# Patient Record
Sex: Female | Born: 1970 | Race: Black or African American | Hispanic: No | Marital: Married | Smoking: Current every day smoker
Health system: Southern US, Community
[De-identification: ages and names within clinical notes are randomized; demographics above are authoritative.]

## PROBLEM LIST (undated history)

## (undated) DIAGNOSIS — I10 Essential (primary) hypertension: Secondary | ICD-10-CM

## (undated) DIAGNOSIS — J45909 Unspecified asthma, uncomplicated: Secondary | ICD-10-CM

## (undated) HISTORY — PX: TUBAL LIGATION: SHX77

---

## 2014-02-19 ENCOUNTER — Emergency Department (HOSPITAL_COMMUNITY)
Admission: EM | Admit: 2014-02-19 | Discharge: 2014-02-19 | Disposition: A | Discharge status: Home / Self Care | Payer: Self-pay | Attending: Emergency Medicine | Admitting: Emergency Medicine

## 2014-02-19 ENCOUNTER — Emergency Department (HOSPITAL_COMMUNITY): Payer: Self-pay

## 2014-02-19 ENCOUNTER — Encounter (HOSPITAL_COMMUNITY): Payer: Self-pay | Admitting: *Deleted

## 2014-02-19 DIAGNOSIS — J45901 Unspecified asthma with (acute) exacerbation: Secondary | ICD-10-CM | POA: Insufficient documentation

## 2014-02-19 DIAGNOSIS — I10 Essential (primary) hypertension: Secondary | ICD-10-CM | POA: Insufficient documentation

## 2014-02-19 DIAGNOSIS — Z720 Tobacco use: Secondary | ICD-10-CM | POA: Insufficient documentation

## 2014-02-19 DIAGNOSIS — E876 Hypokalemia: Secondary | ICD-10-CM | POA: Insufficient documentation

## 2014-02-19 HISTORY — DX: Essential (primary) hypertension: I10

## 2014-02-19 HISTORY — DX: Unspecified asthma, uncomplicated: J45.909

## 2014-02-19 LAB — CBC WITH DIFFERENTIAL/PLATELET
BASOS PCT: 0 % (ref 0–1)
Basophils Absolute: 0 10*3/uL (ref 0.0–0.1)
Eosinophils Absolute: 0 10*3/uL (ref 0.0–0.7)
Eosinophils Relative: 1 % (ref 0–5)
HEMATOCRIT: 33.6 % — AB (ref 36.0–46.0)
HEMOGLOBIN: 11 g/dL — AB (ref 12.0–15.0)
LYMPHS ABS: 0.9 10*3/uL (ref 0.7–4.0)
Lymphocytes Relative: 25 % (ref 12–46)
MCH: 27.5 pg (ref 26.0–34.0)
MCHC: 32.7 g/dL (ref 30.0–36.0)
MCV: 84 fL (ref 78.0–100.0)
Monocytes Absolute: 0.4 10*3/uL (ref 0.1–1.0)
Monocytes Relative: 11 % (ref 3–12)
NEUTROS ABS: 2.3 10*3/uL (ref 1.7–7.7)
NEUTROS PCT: 63 % (ref 43–77)
PLATELETS: 279 10*3/uL (ref 150–400)
RBC: 4 MIL/uL (ref 3.87–5.11)
RDW: 15.2 % (ref 11.5–15.5)
WBC: 3.7 10*3/uL — AB (ref 4.0–10.5)

## 2014-02-19 LAB — BASIC METABOLIC PANEL
Anion gap: 9 (ref 5–15)
BUN: 10 mg/dL (ref 6–23)
CO2: 26 mmol/L (ref 19–32)
CREATININE: 0.9 mg/dL (ref 0.50–1.10)
Calcium: 9.2 mg/dL (ref 8.4–10.5)
Chloride: 104 mmol/L (ref 96–112)
GFR calc Af Amer: 89 mL/min — ABNORMAL LOW (ref >90)
GFR calc non Af Amer: 77 mL/min — ABNORMAL LOW (ref >90)
GLUCOSE: 85 mg/dL (ref 70–99)
Potassium: 2.8 mmol/L — ABNORMAL LOW (ref 3.5–5.1)
SODIUM: 139 mmol/L (ref 135–145)

## 2014-02-19 LAB — I-STAT TROPONIN, ED: Troponin i, poc: 0 ng/mL (ref 0.00–0.08)

## 2014-02-19 MED ORDER — PREDNISONE 20 MG PO TABS: 60.0000 mg | ORAL_TABLET | Freq: Every day | ORAL | Status: AC

## 2014-02-19 MED ORDER — ALBUTEROL SULFATE (2.5 MG/3ML) 0.083% IN NEBU
5.0000 mg | INHALATION_SOLUTION | Freq: Once | RESPIRATORY_TRACT | Status: AC
  Administered 2014-02-19: 5 mg via RESPIRATORY_TRACT
  Filled 2014-02-19: qty 6

## 2014-02-19 MED ORDER — SODIUM CHLORIDE 0.9 % IV BOLUS (SEPSIS)
1000.0000 mL | Freq: Once | INTRAVENOUS | Status: AC
Start: 2014-02-19 — End: 2014-02-19
  Administered 2014-02-19: 1000 mL via INTRAVENOUS

## 2014-02-19 MED ORDER — ALBUTEROL SULFATE HFA 108 (90 BASE) MCG/ACT IN AERS: 2.0000 | INHALATION_SPRAY | RESPIRATORY_TRACT | Status: AC | PRN

## 2014-02-19 MED ORDER — LORAZEPAM 2 MG/ML IJ SOLN
1.0000 mg | Freq: Once | INTRAMUSCULAR | Status: AC
  Administered 2014-02-19: 1 mg via INTRAVENOUS
  Filled 2014-02-19: qty 1

## 2014-02-19 MED ORDER — POTASSIUM CHLORIDE CRYS ER 20 MEQ PO TBCR
40.0000 meq | EXTENDED_RELEASE_TABLET | Freq: Once | ORAL | Status: AC
  Administered 2014-02-19: 40 meq via ORAL
  Filled 2014-02-19: qty 2

## 2014-02-19 MED ORDER — ALBUTEROL (5 MG/ML) CONTINUOUS INHALATION SOLN
10.0000 mg/h | INHALATION_SOLUTION | Freq: Once | RESPIRATORY_TRACT | Status: AC
  Administered 2014-02-19: 10 mg/h via RESPIRATORY_TRACT
  Filled 2014-02-19: qty 20

## 2014-02-19 MED ORDER — POTASSIUM CHLORIDE ER 20 MEQ PO TBCR: 20.0000 meq | EXTENDED_RELEASE_TABLET | Freq: Two times a day (BID) | ORAL | Status: AC

## 2014-02-19 MED ORDER — METHYLPREDNISOLONE SODIUM SUCC 125 MG IJ SOLR
125.0000 mg | Freq: Once | INTRAMUSCULAR | Status: AC
  Administered 2014-02-19: 125 mg via INTRAVENOUS
  Filled 2014-02-19: qty 2

## 2014-02-19 MED ORDER — ALBUTEROL SULFATE HFA 108 (90 BASE) MCG/ACT IN AERS
2.0000 | INHALATION_SPRAY | Freq: Once | RESPIRATORY_TRACT | Status: AC
  Administered 2014-02-19: 2 via RESPIRATORY_TRACT
  Filled 2014-02-19: qty 6.7

## 2014-02-19 NOTE — Discharge Instructions (Signed)
Asthma, Acute Bronchospasm °Acute bronchospasm caused by asthma is also referred to as an asthma attack. Bronchospasm means your air passages become narrowed. The narrowing is caused by inflammation and tightening of the muscles in the air tubes (bronchi) in your lungs. This can make it hard to breathe or cause you to wheeze and cough. °CAUSES °Possible triggers are: °· Animal dander from the skin, hair, or feathers of animals. °· Dust mites contained in house dust. °· Cockroaches. °· Pollen from trees or grass. °· Mold. °· Cigarette or tobacco smoke. °· Air pollutants such as dust, household cleaners, hair sprays, aerosol sprays, paint fumes, strong chemicals, or strong odors. °· Cold air or weather changes. Cold air may trigger inflammation. Winds increase molds and pollens in the air. °· Strong emotions such as crying or laughing hard. °· Stress. °· Certain medicines such as aspirin or beta-blockers. °· Sulfites in foods and drinks, such as dried fruits and wine. °· Infections or inflammatory conditions, such as a flu, cold, or inflammation of the nasal membranes (rhinitis). °· Gastroesophageal reflux disease (GERD). GERD is a condition where stomach acid backs up into your esophagus. °· Exercise or strenuous activity. °SIGNS AND SYMPTOMS  °· Wheezing. °· Excessive coughing, particularly at night. °· Chest tightness. °· Shortness of breath. °DIAGNOSIS  °Your health care provider will ask you about your medical history and perform a physical exam. A chest X-ray or blood testing may be performed to look for other causes of your symptoms or other conditions that may have triggered your asthma attack.  °TREATMENT  °Treatment is aimed at reducing inflammation and opening up the airways in your lungs.  Most asthma attacks are treated with inhaled medicines. These include quick relief or rescue medicines (such as bronchodilators) and controller medicines (such as inhaled corticosteroids). These medicines are sometimes  given through an inhaler or a nebulizer. Systemic steroid medicine taken by mouth or given through an IV tube also can be used to reduce the inflammation when an attack is moderate or severe. Antibiotic medicines are only used if a bacterial infection is present.  °HOME CARE INSTRUCTIONS  °· Rest. °· Drink plenty of liquids. This helps the mucus to remain thin and be easily coughed up. Only use caffeine in moderation and do not use alcohol until you have recovered from your illness. °· Do not smoke. Avoid being exposed to secondhand smoke. °· You play a critical role in keeping yourself in good health. Avoid exposure to things that cause you to wheeze or to have breathing problems. °· Keep your medicines up-to-date and available. Carefully follow your health care provider's treatment plan. °· Take your medicine exactly as prescribed. °· When pollen or pollution is bad, keep windows closed and use an air conditioner or go to places with air conditioning. °· Asthma requires careful medical care. See your health care provider for a follow-up as advised. If you are more than [redacted] weeks pregnant and you were prescribed any new medicines, let your obstetrician know about the visit and how you are doing. Follow up with your health care provider as directed. °· After you have recovered from your asthma attack, make an appointment with your outpatient doctor to talk about ways to reduce the likelihood of future attacks. If you do not have a doctor who manages your asthma, make an appointment with a primary care doctor to discuss your asthma. °SEEK IMMEDIATE MEDICAL CARE IF:  °· You are getting worse. °· You have trouble breathing. If severe, call your local   emergency services (911 in the [place]  You develop chest pain or discomfort.  You are vomiting.  You are not able to keep fluids down.  You are coughing up yellow, green, brown, or bloody sputum.  You have a fever and your symptoms suddenly get worse.  You have  trouble swallowing. MAKE SURE YOU:   Understand these instructions.  Will watch your condition.  Will get help right away if you are not doing well or get worse. Document Released: 04/25/2006 Document Revised: 01/13/2013 Document Reviewed: 07/16/2012 [facility] Patient Information 2015 [place], [place]. This information is not intended to replace advice given to you by your health care provider. Make sure you discuss any questions you have with your health care provider.  Hypokalemia Hypokalemia means that the amount of potassium in the blood is lower than normal.Potassium is a chemical, called an electrolyte, that helps regulate the amount of fluid in the body. It also stimulates muscle contraction and helps nerves function properly.Most of the body's potassium is inside of cells, and only a very small amount is in the blood. Because the amount in the blood is so small, minor changes can be life-threatening. CAUSES  Antibiotics.  Diarrhea or vomiting.  Using laxatives too much, which can cause diarrhea.  Chronic kidney disease.  Water pills (diuretics).  Eating disorders (bulimia).  Low magnesium level.  Sweating a lot. SIGNS AND SYMPTOMS  Weakness.  Constipation.  Fatigue.  Muscle cramps.  Mental confusion.  Skipped heartbeats or irregular heartbeat (palpitations).  Tingling or numbness. DIAGNOSIS  Your health care provider can diagnose hypokalemia with blood tests. In addition to checking your potassium level, your health care provider may also check other lab tests. TREATMENT Hypokalemia can be treated with potassium supplements taken by mouth or adjustments in your current medicines. If your potassium level is very low, you may need to get potassium through a vein (IV) and be monitored in the hospital. A diet high in potassium is also helpful. Foods high in potassium are:  Nuts, such as peanuts and pistachios.  Seeds, such as sunflower seeds and pumpkin  seeds.  Peas, lentils, and lima beans.  Whole grain and bran cereals and breads.  Fresh fruit and vegetables, such as apricots, avocado, bananas, cantaloupe, kiwi, oranges, tomatoes, asparagus, and potatoes.  Orange and tomato juices.  Red meats.  Fruit yogurt. HOME CARE INSTRUCTIONS  Take all medicines as prescribed by your health care provider.  Maintain a healthy diet by including nutritious food, such as fruits, vegetables, nuts, whole grains, and lean meats.  If you are taking a laxative, be sure to follow the directions on the label. SEEK MEDICAL CARE IF:  Your weakness gets worse.  You feel your heart pounding or racing.  You are vomiting or having diarrhea.  You are diabetic and having trouble keeping your blood glucose in the normal range. SEEK IMMEDIATE MEDICAL CARE IF:  You have chest pain, shortness of breath, or dizziness.  You are vomiting or having diarrhea for more than 2 days.  You faint. MAKE SURE YOU:   Understand these instructions.  Will watch your condition.  Will get help right away if you are not doing well or get worse. Document Released: 01/08/2005 Document Revised: 10/29/2012 Document Reviewed: 07/11/2012 [facility] Patient Information 2015 [place], [place]. This information is not intended to replace advice given to you by your health care provider. Make sure you discuss any questions you have with your health care provider.     Emergency Department Resource  Guide 1) Find a Doctor and Pay Out of Pocket Although you won't have to find out who is covered by your insurance plan, it is a good idea to ask around and get recommendations. You will then need to call the office and see if the doctor you have chosen will accept you as a new patient and what types of options they offer for patients who are self-pay. Some doctors offer discounts or will set up payment plans for their patients who do not have insurance, but you will need to ask so you  aren't surprised when you get to your appointment.  2) Contact Your Local Health Department Not all health departments have doctors that can see patients for sick visits, but many do, so it is worth a call to see if yours does. If you don't know where your local health department is, you can check in your phone book. The CDC also has a tool to help you locate your state's health department, and many state websites also have listings of all of their local health departments.  3) Find a Walk-in Clinic If your illness is not likely to be very severe or complicated, you may want to try a walk in clinic. These are popping up all over the country in pharmacies, drugstores, and shopping centers. They're usually staffed by nurse practitioners or physician assistants that have been trained to treat common illnesses and complaints. They're usually fairly quick and inexpensive. However, if you have serious medical issues or chronic medical problems, these are probably not your best option.  No Primary Care Doctor: - Call Health Connect at  [phone] - they can help you locate a primary care doctor that  accepts your insurance, provides certain services, etc. - Physician Referral Service- [phone]  Chronic Pain Problems: Organization         Address  Phone   Notes  [facility]  [phone] Patients need to be referred by their primary care doctor.   Medication Assistance: Organization         Address  Phone   Notes  [facility] Medication [facility] [address] [place], [place] [id number] [phone]) [phone] --Must be a resident of [place] -- Must have NO insurance coverage whatsoever (no Medicaid/ Medicare, etc.) -- The pt. MUST have a primary care doctor that directs their care regularly and follows them in the community   MedAssist  [phone]   [employer]  [phone]    Agencies that provide inexpensive medical care: Organization          Address  Phone   Notes  [facility]  [phone]   [facility]    [phone]   [facility] [address] [place], [place] [address] [phone]) [phone]   [facility] [address], [place] [phone]) [phone]   [facility]    [phone](336) [phone]   [facility]    [phone]) [phone]   [facility]  [address], [place] Phone:  [phone], Fax:  [phone] Hours of Operation:  9 am - 6 pm, M-F.  Also accepts Medicaid/Medicare and self-pay.  [facility]  [address], [place] Phone: [phone](336) [phone], Fax: [phone]) [phone]. Hours of Operation:  8:30 am - 5:30 pm, M-F.  Also accepts Medicaid and self-pay.  [facility] [place] [address],  [place] Phone: [phone]   [facility] [address] Natasha Bence [place], [place] [phone], Ext. [phone] Mondays & Thursdays: 7-9 AM.  First 15 patients are seen on a first come, first serve basis.    Medicaid-accepting [facility] Providers:  Organization         Address  Phone   Notes  [facility] [address], [place] [phone] Also accepts self-pay patients.  [facility] [address] Laurell Josephs [place], [place]  [phone]   [facility] [address], [place] [phone]   [facility] [address], [place] [phone]   Renaye Rakers [address], [place]   [phone] Only accepts [place] patients after they have their name applied to their card.   Self-Pay (no insurance) in [facility]:  Organization         Address  Phone   Notes  Sickle Cell Patients, [facility] [address] [place], [place] [phone]   [facility] [address] [place], [place] [phone]    [facility] [place]  [address], [place] [phone]   [facility]/Dr. Osei-Bonsu  [address] [place] or [address], [place] [phone] Phone number for both [place] and [place] locations is the same.  [facility] [address], [place] [phone]   [facility] [address] [place] or [address] [phone]   [facility] [address], [place] [phone], phone; [phone], fax Sees patients 1st and 3rd Saturday of every month.  Must not qualify for public or private insurance (i.e. Medicaid, Medicare, [place] Health Choice, Veterans' Benefits)  Household income should be no more than 200% of the poverty level The clinic cannot treat you if you are pregnant or think you are pregnant  Sexually transmitted diseases are not treated at the clinic.    Dental Care: Organization         Address  Phone  Notes  [facility] Department of [facility] [address] [place], [place] [phone] Accepts children up to age 19 who are enrolled in [place] or [place] Health Choice; pregnant women with a Medicaid card; and children who have applied for Medicaid or [place] Health Choice, but were declined, whose parents can pay a reduced fee at time of service.  [facility] Department of [facility]  [address], [place] [phone] Accepts children up to age 33 who are enrolled in [place] or [place] Health Choice; pregnant women with a Medicaid card; and children who have applied for Medicaid or [place] Health Choice, but were declined, whose parents can pay a reduced fee at time of service.  [facility]  [address] [place], [place] [phone] Patients are seen by appointment only. Walk-ins are not accepted. [facility] will see patients 33  years of age and older. Monday - Tuesday (8am-5pm) Most Wednesdays (8:30-5pm) $30 per visit, cash only  [facility] Adult Dental Access PROGRAM  [address], [facility] [phone] Patients are seen by appointment only. Walk-ins are not accepted. [facility] will see patients 41 years of age and older. One Wednesday Evening (  Monthly: Volunteer Based).  $30 per visit, cash only  [employer]  [phone] for adults; Children under age 35, call Graduate Pediatric Dentistry at [phone]. Children aged 64-14, please call [phone] to request a pediatric application.  Dental services are provided in all areas of dental care including fillings, crowns and bridges, complete and partial dentures, implants, gum treatment, root canals, and extractions. Preventive care is also provided. Treatment is provided to both adults and children. Patients are selected via a lottery and there is often a waiting list.   [facility] [address] [place]  [phone] [email or web address]   [facility] [address] [place], [place] [phone], Ext. [phone] Second and Fourth Thursday of each month, opens at 6:30 AM; Clinic ends at 9 AM.  Patients are seen on a first-come first-served basis, and a limited number are seen during each clinic.   [facility]  [address] Ether Griffins [place], [place] [phone]   Eligibility Requirements You must have lived in [place], [place], or [place] counties for at least the last three months.   You cannot be eligible for state or federal sponsored [place], including [employer], [place], or [employer].   You generally cannot be eligible for healthcare insurance through your employer.    How to apply: Eligibility screenings are held every Tuesday and Wednesday afternoon from 1:00 pm until 4:00 pm. You do not need an appointment for the interview!  [facility]  [address] [place], [place] [phone]   [facility] Health Department  [phone]   [facility] Health Department  [phone]   [facility] Health Department  [phone]    Behavioral Health Resources in the Community: Intensive Outpatient Programs Organization         Address  Phone  Notes  [facility] [address] [place], [place] [phone]   [facility] Outpatient [address] [place], [place] [phone]   [facility] [address], [place], [place]  [phone]   [facility] Mental Health [address],  [place], [place] [phone] or [phone]   Substance Abuse Resources Organization         Address  Phone  Notes  [facility] Drug [facility]  [phone]   [facility]  [phone]   [facility]  [phone]   Floydene Flock  [phone]   Residential & Outpatient Substance Abuse Program  [phone]   Psychological Services Organization         Address  Phone  Notes  [facility]  [phone]   [facility] Services  [phone]   [facility] Mental Health [address], [place] [phone] or [phone]    Mobile Crisis Teams Organization         Address  Phone  Notes  [facility], Mobile Crisis Care Unit  [phone]   [facility]  [address] [place], [place] [phone]   Doristine Locks [address] [place] [phone]    Self-Help/Support Groups Organization         Address  Phone             Notes  [facility] [place] - variety of support groups  336- I7437963 Call for more information  Narcotics Anonymous (NA), [facility] [address], [employer] [place]  2 meetings at this location   Residential  Treatment Programs Organization         Address  Phone  Notes  [facility] [address],    [place]   [phone]   [facility]  [address] [place] [address], [place], [place] [phone]   [facility] [address] [place], [place] [phone] Admissions: 8am-3pm M-F  [facility] [address]    [place], [place] [phone]   [facility] [address] [place], [place], [place] [phone]   [facility] [address],  [place], [place] [phone]   [facility] - Intensive Outpatient [address] Laurell Josephs [address], [place], [place] [phone]   [facility] ([facility] [address] [place]  [place], [place] [phone] or [phone]   [facility] ([facility]) [address] [place], [place] [phone] Accepts Medicaid  [facility] [address],  [place] [phone] Substance Abuse/Addiction Treatment   [facility] Organization         Address  Phone  Notes  [facility]  [phone]   Angie Fava, PhD [address] Ervin Knack [place], [place]   [phone] or [phone]   [facility] Behavioral   [address] [place], [place] [phone]   [facility] [address] [place], [place] [phone] Insurance/Medicaid/sponsorship through [facility] and Families [address]                                    [place], [place] [phone] Therapy/tele-psych/case  [facility] [address], [place] [phone]    Dr. Lolly Mustache  [phone]   [facility]  [employer] [facility] Dept. 1) [address], [place] 2) [address], [place] 3)  [address] [place] [address], [place] [phone]  [phone]   [facility] Child Abuse Hotline [phone] or [phone] (After Hours)

## 2014-02-19 NOTE — ED Notes (Signed)
Pt stated she couldn't breath.  RR 56, sats 100%, lungs clear.  Dr Elesa MassedWard notified.  Ativan ordered.

## 2014-02-19 NOTE — ED Notes (Signed)
Pt c/o hx of asthma.  Acute onset sob this am while at work.  States she is out of her inhalers.

## 2014-02-19 NOTE — ED Notes (Signed)
Pt states improved anxiety.  VS improved.  Pt requesting coffee.  Given decaf coffee.

## 2014-02-19 NOTE — ED Provider Notes (Addendum)
TIME SEEN: 7:15 AM  CHIEF COMPLAINT: shortness of breath, wheezing, chest tightness  HPI: patient is a 44 y.o. F with history of asthma and hypertension who is a current smoker who presents to the emergency department with shortness of breath, wheezing, productive cough with brown sputum production, chest pain with coughing and chest tightness that started this morning. States she feels that she is having an asthma exacerbation. She is out of her Proventil.  No history of MI, CAD, PE or DVT.  ROS: See HPI Constitutional: no fever  Eyes: no drainage  ENT: no runny nose   Cardiovascular:   chest pain  Resp:  SOB  GI: no vomiting GU: no dysuria Integumentary: no rash  Allergy: no hives  Musculoskeletal: no leg swelling  Neurological: no slurred speech ROS otherwise negative  PAST MEDICAL HISTORY/PAST SURGICAL HISTORY:  Past Medical History  Diagnosis Date  . Asthma   . Hypertension     MEDICATIONS:  Prior to Admission medications   Not on File    ALLERGIES:  No Known Allergies  SOCIAL HISTORY:  History  Substance Use Topics  . Smoking status: Current Every Day Smoker -- 0.15 packs/day    Types: Cigarettes  . Smokeless tobacco: Not on file  . Alcohol Use: No    FAMILY HISTORY: No family history on file.  EXAM: BP 156/92 mmHg  Pulse 92  Temp(Src) 98.4 F (36.9 C)  Resp 34  SpO2 99%  LMP 02/07/2014 CONSTITUTIONAL: Alert and oriented and responds appropriately to questions. Well-appearing; well-nourished HEAD: Normocephalic EYES: Conjunctivae clear, PERRL ENT: normal nose; no rhinorrhea; moist mucous membranes; pharynx without lesions noted NECK: Supple, no meningismus, no LAD  CARD: RRR; S1 and S2 appreciated; no murmurs, no clicks, no rubs, no gallops RESP: Normal chest excursion without splinting, patient is tachypneic, diffuse extort wheezing, diminished at her bases bilaterally, no rhonchi or rales, no hypoxia ABD/GI: Normal bowel sounds; non-distended;  soft, non-tender, no rebound, no guarding BACK:  The back appears normal and is non-tender to palpation, there is no CVA tenderness EXT: Normal ROM in all joints; non-tender to palpation; no edema; normal capillary refill; no cyanosis; tenderness or swelling SKIN: Normal color for age and race; warm NEURO: Moves all extremities equally PSYCH: The patient's mood and manner are appropriate. Grooming and personal hygiene are appropriate.  MEDICAL DECISION MAKING: Ptient here with likely asthma exacerbation. We'll give continuous albuterol, Solu-Medrol. We'll check basic labs including troponin given she is 44 year old African-American female with history of hypertension and tobacco use although I suspect her chest tightness is secondary to an asthma exacerbation. We'll also obtain portable chest x-ray.  ED PROGRESS: Patient's labs are unremarkable other than mild hypokalemia with no interval changes on EKG. Likely secondary to albuterol use. Will replace and discharged on several days of potassium. Her lungs are now completely clear to auscultation and she reports feeling much better. Chest x-ray clear. Troponin negative.  We'll discharge her with prescription for albuterol inhaler, prednisone burst. Discussed return precautions.  Patient verbalizes understanding and is comfortable with plan.   9:20 AM  Pt ambulated to the restroom and when she came back was tachycardic, very anxious, diaphoretic. Was able to be calmed down very easily and redirected in her heart rate improved, respiratory rate improved. Her lungs are still completely clear to auscultation with good aeration. She is satting 100% on room air. Suspect this is a panic attack triggered by albuterol. Will give dose of IV Ativan and closely monitor.  10:00  AM  Pt's anxiety has improved. Heart rate is normal low 100s. She's no longer tachypneic. We'll discharge home.    EKG Interpretation  Date/Time:  Friday February 19 2014 07:34:35  EST Ventricular Rate:  87 PR Interval:  176 QRS Duration: 94 QT Interval:  396 QTC Calculation: 476 R Axis:   -45 Text Interpretation:  Sinus rhythm Left anterior fascicular block Borderline T abnormalities, anterior leads No old tracing to compare Confirmed by Erasmo Vertz,  DO, Saryn Cherry [phone]) on 02/19/2014 7:41:10 AM          Layla Maw Laurren Lepkowski, DO 02/19/14 6045  Layla Maw Chanell Nadeau, DO 02/19/14 1729

## 2014-03-11 ENCOUNTER — Emergency Department (HOSPITAL_COMMUNITY): Payer: Self-pay

## 2014-03-11 ENCOUNTER — Encounter (HOSPITAL_COMMUNITY): Payer: Self-pay | Admitting: *Deleted

## 2014-03-11 ENCOUNTER — Emergency Department (HOSPITAL_COMMUNITY)
Admission: EM | Admit: 2014-03-11 | Discharge: 2014-03-11 | Disposition: A | Discharge status: Home / Self Care | Payer: Self-pay | Attending: Emergency Medicine | Admitting: Emergency Medicine

## 2014-03-11 DIAGNOSIS — J45901 Unspecified asthma with (acute) exacerbation: Secondary | ICD-10-CM | POA: Insufficient documentation

## 2014-03-11 DIAGNOSIS — I10 Essential (primary) hypertension: Secondary | ICD-10-CM | POA: Insufficient documentation

## 2014-03-11 DIAGNOSIS — Z720 Tobacco use: Secondary | ICD-10-CM | POA: Insufficient documentation

## 2014-03-11 DIAGNOSIS — Z7952 Long term (current) use of systemic steroids: Secondary | ICD-10-CM | POA: Insufficient documentation

## 2014-03-11 DIAGNOSIS — Z79899 Other long term (current) drug therapy: Secondary | ICD-10-CM | POA: Insufficient documentation

## 2014-03-11 MED ORDER — PREDNISONE 20 MG PO TABS: 20.0000 mg | ORAL_TABLET | Freq: Two times a day (BID) | ORAL | Status: AC

## 2014-03-11 MED ORDER — ALBUTEROL SULFATE HFA 108 (90 BASE) MCG/ACT IN AERS
2.0000 | INHALATION_SPRAY | Freq: Once | RESPIRATORY_TRACT | Status: AC
  Administered 2014-03-11: 2 via RESPIRATORY_TRACT
  Filled 2014-03-11: qty 6.7

## 2014-03-11 MED ORDER — PREDNISONE 20 MG PO TABS
60.0000 mg | ORAL_TABLET | Freq: Once | ORAL | Status: DC
  Filled 2014-03-11 (×2): qty 3

## 2014-03-11 MED ORDER — ALBUTEROL SULFATE (2.5 MG/3ML) 0.083% IN NEBU
5.0000 mg | INHALATION_SOLUTION | Freq: Once | RESPIRATORY_TRACT | Status: AC
  Administered 2014-03-11: 5 mg via RESPIRATORY_TRACT
  Filled 2014-03-11: qty 6

## 2014-03-11 MED ORDER — IPRATROPIUM-ALBUTEROL 0.5-2.5 (3) MG/3ML IN SOLN: 3.0000 mL | Freq: Once | RESPIRATORY_TRACT | Status: DC

## 2014-03-11 MED ORDER — PREDNISONE 20 MG PO TABS
60.0000 mg | ORAL_TABLET | Freq: Once | ORAL | Status: AC
  Administered 2014-03-11: 60 mg via ORAL
  Filled 2014-03-11: qty 3

## 2014-03-11 NOTE — ED Notes (Signed)
Pt states she was unable to buy her inhaler because it cost $84 at [facility]. Pt had Albuterol tx earlier at   At triage. States is feeling better.

## 2014-03-11 NOTE — ED Provider Notes (Signed)
CSN: [id number]     Arrival date & time 03/11/14  1335 History  This chart was scribed for non-physician practitioner [name] Vielka Klinedinst, PA-C, working with [name] PayorPickering, MD by Littie Deedsichard Sun, ED Scribe. This patient was seen in room TR06C/TR06C and the patient's care was started at 2:44 PM.     Chief Complaint  Patient presents with  . Asthma   The history is provided by the patient. No language interpreter was used.   HPI Comments: Stacey Grant is a 44 y.o. female with a hx of asthma and HTN who presents to the Emergency Department complaining of asthma exacerbation that started 4 days ago but worsened today. Patient reports having productive cough. She states she has run out of her inhaler 4 days ago. [facility] but they had run out. Patient also went to [facility], but they were more expensive and she could not afford it there. She denies SOB, chest pain, wheezing and fever.  No PCP per patient; she is new to the area. Patient will be on Obamacare March 1st.  Past Medical History  Diagnosis Date  . Asthma   . Hypertension    Past Surgical History  Procedure Laterality Date  . Tubal ligation     No family history on file. History  Substance Use Topics  . Smoking status: Current Every Day Smoker -- 0.15 packs/day    Types: Cigarettes  . Smokeless tobacco: Not on file  . Alcohol Use: No   OB History    No data available     Review of Systems  Constitutional: Negative for fever.  HENT: Negative for rhinorrhea and trouble swallowing.   Respiratory: Positive for cough. Negative for shortness of breath and wheezing.   Cardiovascular: Negative for chest pain.  Gastrointestinal: Negative for nausea and vomiting.  Genitourinary: Negative for dysuria.  Neurological: Negative for dizziness, light-headedness and headaches.  Psychiatric/Behavioral: Negative.       Allergies  Review of patient's allergies indicates no known  allergies.  Home Medications   Prior to Admission medications   Medication Sig Start Date End Date Taking? Authorizing Provider  albuterol (PROVENTIL HFA;VENTOLIN HFA) 108 (90 BASE) MCG/ACT inhaler Inhale 2 puffs into the lungs every 4 (four) hours as needed for wheezing or shortness of breath. 02/19/14   [name], DO  potassium chloride 20 MEQ TBCR Take 20 mEq by mouth 2 (two) times daily. 02/19/14   [name], DO  predniSONE (DELTASONE) 20 MG tablet Take 3 tablets (60 mg total) by mouth daily. 02/19/14   [name], DO  predniSONE (DELTASONE) 20 MG tablet Take 1 tablet (20 mg total) by mouth 2 (two) times daily with a meal. 03/11/14   [name], PA-C   BP 178/103 mmHg  Pulse 69  Temp(Src) 98.2 F (36.8 C) (Oral)  Resp 18  Ht 5\' 2"  (1.575 m)  Wt 127 lb (57.607 kg)  BMI 23.22 kg/m2  SpO2 100%  LMP 03/08/2014 Physical Exam  Constitutional: She is oriented to person, place, and time. She appears well-developed and well-nourished. No distress.  HENT:  Head: Normocephalic and atraumatic.  Mouth/Throat: Oropharynx is clear and moist. No oropharyngeal exudate.  Eyes: Pupils are equal, round, and reactive to light.  Neck: Neck supple.  Cardiovascular: Normal rate, regular rhythm, S1 normal, S2 normal and normal heart sounds.  Exam reveals no gallop and no friction rub.   No murmur heard. Pulmonary/Chest: Effort  normal and breath sounds normal. No accessory muscle usage. No tachypnea. No respiratory distress. She has no wheezes. She has no rhonchi. She has no rales.  CTAB.  Musculoskeletal: She exhibits no edema.  Neurological: She is alert and oriented to person, place, and time. No cranial nerve deficit. GCS eye subscore is 4. GCS verbal subscore is 5. GCS motor subscore is 6.  Patient answering questions appropriately in full, clear sentences. Cranial nerves II through XII grossly intact. Motor strength 5 out of 5 in all major muscle groups of upper and lower extremities.  Distal sensation intact.  Skin: Skin is warm and dry. No rash noted.  Psychiatric: She has a normal mood and affect. Her behavior is normal.  Nursing note and vitals reviewed.   ED Course  Procedures  DIAGNOSTIC STUDIES: Oxygen Saturation is 98% on room air, normal by my interpretation.    COORDINATION OF CARE: 2:48 PM-Discussed treatment plan which includes inhalers with pt at bedside and pt agreed to plan.    Labs Review Labs Reviewed - No data to display  Imaging Review Dg Chest 2 View (if Patient Has Fever And/or Copd)  03/11/2014   CLINICAL DATA:  Asthma  EXAM: CHEST  2 VIEW  COMPARISON:  02/19/2014  FINDINGS: The heart size and mediastinal contours are within normal limits. Both lungs are clear. The visualized skeletal structures are unremarkable.  IMPRESSION: No active cardiopulmonary disease.   Electronically Signed   By: Alcide Clever M.D.   On: 03/11/2014 14:38     EKG Interpretation None      MDM   Final diagnoses:  Asthma exacerbation   Patient here with asthma exacerbation, symptoms resolve completely after albuterol nebulizer 1. Patient stating her main complaint is that she was unable to afford an inhaler at her drugstore. On my examination, there is no wheezing or respiratory distress noted, and patient is asymptomatic. Patient is afebrile, non-tachycardic, nontachypneic, non-hypoxic, well-appearing and in no acute distress. PERC negative.  Patient given albuterol inhaler here, we'll place patient on short course of prednisone for asthma exacerbation. Radiographs unremarkable for acute pathology. No concern for pneumonia. I discussed return precautions with patient, and strongly encouraged her to follow-up with a primary care provider, also provided her resource guide to help her find one. Patient verbalizes understanding and agreement of this plan. I encouraged patient to call or return to the ER should she have any questions or concerns.  I personally performed  the services described in this documentation, which was scribed in my presence. The recorded information has been reviewed and is accurate.  BP 178/103 mmHg  Pulse 69  Temp(Src) 98.2 F (36.8 C) (Oral)  Resp 18  Ht  (1.575 m)  Wt 127 lb (57.607 kg)  BMI 23.22 kg/m2  SpO2 100%  LMP 03/08/2014  Signed,  Ladona Mow, PA-C 3:19 PM    Monte Fantasia, PA-C 03/11/14 1519  Juliet Rude. Rubin Payor, MD 03/11/14 [phone]

## 2014-03-11 NOTE — Discharge Instructions (Signed)
Asthma, Acute Bronchospasm °Acute bronchospasm caused by asthma is also referred to as an asthma attack. Bronchospasm means your air passages become narrowed. The narrowing is caused by inflammation and tightening of the muscles in the air tubes (bronchi) in your lungs. This can make it hard to breathe or cause you to wheeze and cough. °CAUSES °Possible triggers are: °· Animal dander from the skin, hair, or feathers of animals. °· Dust mites contained in house dust. °· Cockroaches. °· Pollen from trees or grass. °· Mold. °· Cigarette or tobacco smoke. °· Air pollutants such as dust, household cleaners, hair sprays, aerosol sprays, paint fumes, strong chemicals, or strong odors. °· Cold air or weather changes. Cold air may trigger inflammation. Winds increase molds and pollens in the air. °· Strong emotions such as crying or laughing hard. °· Stress. °· Certain medicines such as aspirin or beta-blockers. °· Sulfites in foods and drinks, such as dried fruits and wine. °· Infections or inflammatory conditions, such as a flu, cold, or inflammation of the nasal membranes (rhinitis). °· Gastroesophageal reflux disease (GERD). GERD is a condition where stomach acid backs up into your esophagus. °· Exercise or strenuous activity. °SIGNS AND SYMPTOMS  °· Wheezing. °· Excessive coughing, particularly at night. °· Chest tightness. °· Shortness of breath. °DIAGNOSIS  °Your health care provider will ask you about your medical history and perform a physical exam. A chest X-ray or blood testing may be performed to look for other causes of your symptoms or other conditions that may have triggered your asthma attack.  °TREATMENT  °Treatment is aimed at reducing inflammation and opening up the airways in your lungs.  Most asthma attacks are treated with inhaled medicines. These include quick relief or rescue medicines (such as bronchodilators) and controller medicines (such as inhaled corticosteroids). These medicines are sometimes  given through an inhaler or a nebulizer. Systemic steroid medicine taken by mouth or given through an IV tube also can be used to reduce the inflammation when an attack is moderate or severe. Antibiotic medicines are only used if a bacterial infection is present.  °HOME CARE INSTRUCTIONS  °· Rest. °· Drink plenty of liquids. This helps the mucus to remain thin and be easily coughed up. Only use caffeine in moderation and do not use alcohol until you have recovered from your illness. °· Do not smoke. Avoid being exposed to secondhand smoke. °· You play a critical role in keeping yourself in good health. Avoid exposure to things that cause you to wheeze or to have breathing problems. °· Keep your medicines up-to-date and available. Carefully follow your health care provider's treatment plan. °· Take your medicine exactly as prescribed. °· When pollen or pollution is bad, keep windows closed and use an air conditioner or go to places with air conditioning. °· Asthma requires careful medical care. See your health care provider for a follow-up as advised. If you are more than [redacted] weeks pregnant and you were prescribed any new medicines, let your obstetrician know about the visit and how you are doing. Follow up with your health care provider as directed. °· After you have recovered from your asthma attack, make an appointment with your outpatient doctor to talk about ways to reduce the likelihood of future attacks. If you do not have a doctor who manages your asthma, make an appointment with a primary care doctor to discuss your asthma. °SEEK IMMEDIATE MEDICAL CARE IF:  °· You are getting worse. °· You have trouble breathing. If severe, call your local   emergency services (911 in the [place] °· You develop chest pain or discomfort. °· You are vomiting. °· You are not able to keep fluids down. °· You are coughing up yellow, green, brown, or bloody sputum. °· You have a fever and your symptoms suddenly get worse. °· You have  trouble swallowing. °MAKE SURE YOU:  °· Understand these instructions. °· Will watch your condition. °· Will get help right away if you are not doing well or get worse. °Document Released: 04/25/2006 Document Revised: 01/13/2013 Document Reviewed: 07/16/2012 °ExitCare® Patient Information ©2015 [employer]. This information is not intended to replace advice given to you by your health care provider. Make sure you discuss any questions you have with your health care provider. ° ° °Emergency Department Resource Guide °1) Find a Doctor and Pay Out of Pocket °Although you won't have to find out who is covered by your insurance plan, it is a good idea to ask around and get recommendations. You will then need to call the office and see if the doctor you have chosen will accept you as a new patient and what types of options they offer for patients who are self-pay. Some doctors offer discounts or will set up payment plans for their patients who do not have insurance, but you will need to ask so you aren't surprised when you get to your appointment. ° °2) Contact Your Local Health Department °Not all health departments have doctors that can see patients for sick visits, but many do, so it is worth a call to see if yours does. If you don't know where your local health department is, you can check in your phone book. The CDC also has a tool to help you locate your state's health department, and many state websites also have listings of all of their local health departments. ° °3) Find a Walk-in Clinic °If your illness is not likely to be very severe or complicated, you may want to try a walk in clinic. These are popping up all over the country in pharmacies, drugstores, and shopping centers. They're usually staffed by nurse practitioners or physician assistants that have been trained to treat common illnesses and complaints. They're usually fairly quick and inexpensive. However, if you have serious medical issues or chronic  medical problems, these are probably not your best option. ° °No Primary Care Doctor: °- Call [facility] at  832-8000 - they can help you locate a primary care doctor that  accepts your insurance, provides certain services, etc. °- Physician Referral Service- 1-800-533-3463 ° °Chronic Pain Problems: °Organization         Address  Phone   Notes  °[facility]  (336) 297-2271 Patients need to be referred by their primary care doctor.  ° °Medication Assistance: °Organization         Address  Phone   Notes  °[place] [facility] [address] °[place], [place] [address] (336) 641-8030 --Must be a resident of [place] °-- Must have NO insurance coverage whatsoever (no Medicaid/ Medicare, etc.) °-- The pt. MUST have a primary care doctor that directs their care regularly and follows them in the community °  °[facility]  (866) 331-1348   °[employer]  (888) 892-1162   ° °Agencies that provide inexpensive medical care: °Organization         Address  Phone   Notes  °[facility]  (336) 832-8035   °[facility]    (  336) 832-7272   °[facility] [address] °[place], [place] [address] (336) 832-4777   °[facility] [address], °[place] (336) 271-4999   °[facility]    (336) 373-0678   °[facility]    (336) 272-1050   °[facility] ° [address], [place] Phone:  (336) 832-4444, Fax:  (336) 832-4440 Hours of Operation:  9 am - 6 pm, M-F.  Also accepts Medicaid/Medicare and self-pay.  °[facility] ° [address], [place] Phone: (336) 832-3150, Fax: (336) 832-3151. Hours of Operation:  8:30 am - 5:30 pm, M-F.  Also accepts Medicaid and self-pay.  °[facility] [place] [address], [place] Phone: (336) 878-6027   °[facility] [address], [place], [place] (336)723-1848,  Ext. 123 Mondays & Thursdays: 7-9 AM.  First 15 patients are seen on a first come, first serve basis. °  ° °Medicaid-accepting [place] Providers: ° °Organization         Address  Phone   Notes  °[facility] [address], [place] (336) 641-2100 Also accepts self-pay patients.  °[facility] [address], [place] ° (336) 856-9996   °[facility] [address], [place] (336) 288-8857   °[facility] [address] [place] [address], [place] (336) 299-7000   °Veita Bland [address], [place]  ° (336) 373-1557 Only accepts [place] Access Medicaid patients after they have their name applied to their card.  ° °Self-Pay (no insurance) in [place]: ° °Organization         Address  Phone   Notes  °Sickle Cell Patients, [facility] [address], [place] (336) 832-1970   °[facility] [address], [place] (336) 832-4400   °[facility] ° [address], [place] (336) 992-4800   °[facility]/Dr. Osei-Bonsu ° [address] [place] [address], [place] or [address], [place] (336) 841-8500 Phone number for both [place] and [place] locations is the same.  °[facility] [address], [place] (336) 299-0000   °[facility] [address] [place] [address],  or [address] (336) 852-7530 °(336) 878-2260   °[facility] [address],  (336) 350-1642, phone; (336) 294-5005, fax Sees patients 1st and 3rd Saturday of every month.  Must not qualify for public or private insurance (i.e. Medicaid, Medicare, [place] Health Choice, Veterans' Benefits) • Household income should be no more than 200% of the poverty level •The clinic cannot treat you if you are pregnant or think you are pregnant • Sexually transmitted diseases are not  treated at the clinic.  ° ° °Dental Care: °Organization         Address  Phone  Notes  °[facility] [facility] [address],  (336) 641-6152 Accepts children up to age 21 who are enrolled in Medicaid or [place] Health Choice; pregnant women with a Medicaid card; and children who have applied for Medicaid or [place] Health Choice, but were declined, whose parents can pay a reduced fee at time of service.  °[facility] [place]  [address], [place] (336) 641-7733 Accepts children up   to age 21 who are enrolled in Medicaid or [place] Health Choice; pregnant women with a Medicaid card; and children who have applied for Medicaid or [place] Health Choice, but were declined, whose parents can pay a reduced fee at time of service.  °[facility] ° [address], [place] (336) 641-4533 Patients are seen by appointment only. Walk-ins are not accepted. [facility] will see patients 18 years of age and older. °Monday - Tuesday (8am-5pm) °Most Wednesdays (8:30-5pm) °$30 per visit, cash only  °[facility] ° [address], [place] (336) 641-4533 Patients are seen by appointment only. Walk-ins are not accepted. [facility] will see patients 18 years of age and older. °One Wednesday Evening (Monthly: Volunteer Based).  $30 per visit, cash only  °[facility]  (919) 537-3737 for adults; Children under age 4, call Graduate Pediatric Dentistry at (919) [phone]. Children aged 4-14, please call (919) [phone] to request a pediatric application. ° Dental services are provided in all areas of dental care including fillings, crowns and bridges, complete and partial dentures, implants, gum treatment, root canals, and extractions. Preventive care is also provided. Treatment is provided to both adults and children. °Patients are selected via a lottery and there is  often a waiting list. °  °[facility] [address], °[place] ° (336) 763-8833 [email or web address] °  °[facility] [address], [place], [place] (336)723-1848, Ext. 123 Second and Fourth Thursday of each month, opens at 6:30 AM; Clinic ends at 9 AM.  Patients are seen on a first-come first-served basis, and a limited number are seen during each clinic.  ° °[facility] ° [address], [place], [place] (336) 723-7904   Eligibility Requirements °You must have lived in [place], [place], or [place] counties for at least the last three months. °  You cannot be eligible for state or federal sponsored healthcare insurance, including Veterans Administration, Medicaid, or Medicare. °  You generally cannot be eligible for healthcare insurance through your employer.  °  How to apply: °Eligibility screenings are held every Tuesday and Wednesday afternoon from 1:00 pm until 4:00 pm. You do not need an appointment for the interview!  °[facility] [address], [place], [place] 336-631-2330   °[facility]  336-342-8273   °[facility]  336-703-3100   °[facility]  336-570-6415   ° °Behavioral Health Resources in the Community: °Intensive Outpatient Programs °Organization         Address  Phone  Notes  °[facility] [address], [place], [place] 336-878-6098   °[facility] [address], [place], [place] 336-832-9800   °[facility] [address], [place], [place] ° 336-882-2125   °[facility] [address],  °[place], [place] 1-800-853-5163 or 336-641-4981   °Substance Abuse Resources °Organization         Address  Phone  Notes  °[facility]  336-882-2125   °[facility]  336-784-9470   °[facility]  336-285-9073   °[facility]  336-845-3988   °Residential & Outpatient Substance  Abuse Program  1-800-659-3381   °Psychological Services °Organization         Address  Phone  Notes  °[facility]  336- 832-9600   °[facility]  336- 378-7881   °[facility]   [address], [place] 1-800-853-5163 or 336-641-4981   ° °Mobile Crisis Teams °Organization         Address  Phone  Notes  °[facility], Mobile Crisis Care Unit  1-877-626-1772   °Assertive °[facility] ° [address] [place], [place] 336-834-9664   °Sharon DeEsch [address] °[place] 336-554-5454   ° °Self-Help/Support Groups °Organization         Address  Phone             Notes  °[facility] - variety of support groups  336- 373-1402 Call for more information  °Narcotics Anonymous (NA), [facility] [address], °[place]  2 meetings at this location  ° °Residential Treatment Programs °Organization         Address  Phone  Notes  °[facility] [address],    °[place]  1-866-801-8205   °[facility] ° [address], [place], [place] 704-293-8524   °[facility] [address], [place] 336-845-3988 Admissions: 8am-3pm M-F  °[facility] [address]    °[place], [place] 336-841-1104   °[facility] [address], [place], [place] 336-379-7146   °[facility] [address]  °[place], [place] 336-285-9073   °[facility] - Intensive Outpatient [address], , [place] 336-852-3033   °[facility] [address]  °[place], [place] 1-877-615-2722 or 336-784-9470   °[facility] [address] [place], [place] 336-227-7417 Accepts Medicaid  °[facility] [address]  ° [place] 1-800-659-3381 Substance Abuse/Addiction Treatment  ° °[place] Behavioral Health Resources °Organization          Address  Phone  Notes  °[facility]  (888) 581-9988   °Julie Brannon, PhD [address] [place], [place]   (336) 349-5553 or (336) 951-0000   °[facility]   [address] °[place], [place] (336) 349-4454   °[facility] [address], [place], [place] (336) 342-8316 Insurance/Medicaid/sponsorship through [facility]  °[facility] [address]                                    [place], [place] (336) 342-8316 Therapy/tele-psych/case  °[facility] [address]  ° [place], [place] (336) 349-2233    °Dr. Arfeen  (336) 349-4544   °[facility] [place]  [employer] [place] [facility] 1) [address], [place] °2) [address], [place] °3)  [address], [place] (336) 349-3220 °(336) 342-7768 ° °(336) 342-8140   °[place] [facility] (336) 342-1394 or (336) 342-3537 (After Hours)    ° ° ° °

## 2014-03-11 NOTE — ED Notes (Signed)
Pt ran out of her inhaler 4 days ago.  Since then has been experiencing sob and productive coughing.

## 2014-08-03 ENCOUNTER — Encounter (HOSPITAL_COMMUNITY): Payer: Self-pay | Admitting: Neurology

## 2014-08-03 ENCOUNTER — Emergency Department (HOSPITAL_COMMUNITY)
Admission: EM | Admit: 2014-08-03 | Discharge: 2014-08-03 | Disposition: A | Discharge status: Home / Self Care | Payer: Self-pay | Attending: Emergency Medicine | Admitting: Emergency Medicine

## 2014-08-03 ENCOUNTER — Emergency Department (HOSPITAL_COMMUNITY): Payer: Self-pay

## 2014-08-03 DIAGNOSIS — Z7952 Long term (current) use of systemic steroids: Secondary | ICD-10-CM | POA: Insufficient documentation

## 2014-08-03 DIAGNOSIS — J029 Acute pharyngitis, unspecified: Secondary | ICD-10-CM | POA: Insufficient documentation

## 2014-08-03 DIAGNOSIS — R112 Nausea with vomiting, unspecified: Secondary | ICD-10-CM | POA: Insufficient documentation

## 2014-08-03 DIAGNOSIS — Z720 Tobacco use: Secondary | ICD-10-CM | POA: Insufficient documentation

## 2014-08-03 DIAGNOSIS — J45901 Unspecified asthma with (acute) exacerbation: Secondary | ICD-10-CM | POA: Insufficient documentation

## 2014-08-03 DIAGNOSIS — R197 Diarrhea, unspecified: Secondary | ICD-10-CM | POA: Insufficient documentation

## 2014-08-03 DIAGNOSIS — I10 Essential (primary) hypertension: Secondary | ICD-10-CM | POA: Insufficient documentation

## 2014-08-03 DIAGNOSIS — J40 Bronchitis, not specified as acute or chronic: Secondary | ICD-10-CM

## 2014-08-03 DIAGNOSIS — Z880 Allergy status to penicillin: Secondary | ICD-10-CM | POA: Insufficient documentation

## 2014-08-03 DIAGNOSIS — R509 Fever, unspecified: Secondary | ICD-10-CM | POA: Insufficient documentation

## 2014-08-03 DIAGNOSIS — Z79899 Other long term (current) drug therapy: Secondary | ICD-10-CM | POA: Insufficient documentation

## 2014-08-03 LAB — URINALYSIS, ROUTINE W REFLEX MICROSCOPIC
Bilirubin Urine: NEGATIVE
Glucose, UA: NEGATIVE mg/dL
Ketones, ur: NEGATIVE mg/dL
Nitrite: NEGATIVE
Protein, ur: NEGATIVE mg/dL
Specific Gravity, Urine: 1.013 (ref 1.005–1.030)
Urobilinogen, UA: 0.2 mg/dL (ref 0.0–1.0)
pH: 5.5 (ref 5.0–8.0)

## 2014-08-03 LAB — RAPID STREP SCREEN (MED CTR MEBANE ONLY): Streptococcus, Group A Screen (Direct): NEGATIVE

## 2014-08-03 LAB — CBC
HCT: 35.4 % — ABNORMAL LOW (ref 36.0–46.0)
Hemoglobin: 11.5 g/dL — ABNORMAL LOW (ref 12.0–15.0)
MCH: 27.9 pg (ref 26.0–34.0)
MCHC: 32.5 g/dL (ref 30.0–36.0)
MCV: 85.9 fL (ref 78.0–100.0)
Platelets: 331 10*3/uL (ref 150–400)
RBC: 4.12 MIL/uL (ref 3.87–5.11)
RDW: 15.3 % (ref 11.5–15.5)
WBC: 5.6 10*3/uL (ref 4.0–10.5)

## 2014-08-03 LAB — COMPREHENSIVE METABOLIC PANEL
ALT: 12 U/L — ABNORMAL LOW (ref 14–54)
AST: 16 U/L (ref 15–41)
Albumin: 4.1 g/dL (ref 3.5–5.0)
Alkaline Phosphatase: 50 U/L (ref 38–126)
Anion gap: 9 (ref 5–15)
BUN: 6 mg/dL (ref 6–20)
CO2: 26 mmol/L (ref 22–32)
Calcium: 9.2 mg/dL (ref 8.9–10.3)
Chloride: 103 mmol/L (ref 101–111)
Creatinine, Ser: 0.78 mg/dL (ref 0.44–1.00)
GFR calc Af Amer: >60 mL/min (ref >60)
GFR calc non Af Amer: >60 mL/min (ref >60)
Glucose, Bld: 85 mg/dL (ref 65–99)
Potassium: 3.8 mmol/L (ref 3.5–5.1)
Sodium: 138 mmol/L (ref 135–145)
Total Bilirubin: 0.6 mg/dL (ref 0.3–1.2)
Total Protein: 7 g/dL (ref 6.5–8.1)

## 2014-08-03 LAB — I-STAT BETA HCG BLOOD, ED (MC, WL, AP ONLY): I-stat hCG, quantitative: <5 m[IU]/mL (ref <5)

## 2014-08-03 LAB — LIPASE, BLOOD: Lipase: 19 U/L — ABNORMAL LOW (ref 22–51)

## 2014-08-03 LAB — URINE MICROSCOPIC-ADD ON

## 2014-08-03 MED ORDER — GUAIFENESIN-CODEINE 100-10 MG/5ML PO SOLN
10.0000 mL | Freq: Once | ORAL | Status: AC
Start: 2014-08-03 — End: 2014-08-03
  Administered 2014-08-03: 10 mL via ORAL
  Filled 2014-08-03: qty 10

## 2014-08-03 MED ORDER — ALBUTEROL SULFATE HFA 108 (90 BASE) MCG/ACT IN AERS
2.0000 | INHALATION_SPRAY | Freq: Once | RESPIRATORY_TRACT | Status: AC
  Administered 2014-08-03: 2 via RESPIRATORY_TRACT
  Filled 2014-08-03: qty 6.7

## 2014-08-03 MED ORDER — LISINOPRIL 10 MG PO TABS: 10.0000 mg | ORAL_TABLET | Freq: Every day | ORAL | Status: AC

## 2014-08-03 MED ORDER — PREDNISONE 20 MG PO TABS: 40.0000 mg | ORAL_TABLET | Freq: Every day | ORAL | Status: AC

## 2014-08-03 MED ORDER — HYDROCHLOROTHIAZIDE 25 MG PO TABS: 25.0000 mg | ORAL_TABLET | Freq: Every day | ORAL | Status: AC

## 2014-08-03 MED ORDER — PREDNISONE 20 MG PO TABS
60.0000 mg | ORAL_TABLET | Freq: Once | ORAL | Status: AC
  Administered 2014-08-03: 60 mg via ORAL
  Filled 2014-08-03: qty 3

## 2014-08-03 NOTE — ED Notes (Signed)
Pt reports sore throat, coughing, vomiting, diarrhea for 1 week. Reports grandchild was sick then she got sick.

## 2014-08-03 NOTE — ED Provider Notes (Signed)
CSN: [id number]     Arrival date & time 08/03/14  4098 History   First MD Initiated Contact with Patient 08/03/14 1259     Chief Complaint  Patient presents with  . Sore Throat  . Emesis  . Asthma     (Consider location/radiation/quality/duration/timing/severity/associated sxs/prior Treatment) HPI   44 year old female with sore throat onset about a week ago. Associated with a nonproductive cough. Nausea and vomiting and diarrhea. No abdominal pain. Subjective fever. Grandchildren have recently been sick. No urinary complaints.  Past Medical History  Diagnosis Date  . Asthma   . Hypertension    Past Surgical History  Procedure Laterality Date  . Tubal ligation     No family history on file. History  Substance Use Topics  . Smoking status: Current Every Day Smoker -- 0.15 packs/day    Types: Cigarettes  . Smokeless tobacco: Not on file  . Alcohol Use: No   OB History    No data available     Review of Systems  All systems reviewed and negative, other than as noted in HPI.   Allergies  Amoxicillin  Home Medications   Prior to Admission medications   Medication Sig Start Date End Date Taking? Authorizing Provider  albuterol (PROVENTIL HFA;VENTOLIN HFA) 108 (90 BASE) MCG/ACT inhaler Inhale 2 puffs into the lungs every 4 (four) hours as needed for wheezing or shortness of breath. 02/19/14   [name], DO  potassium chloride 20 MEQ TBCR Take 20 mEq by mouth 2 (two) times daily. 02/19/14   [name], DO  predniSONE (DELTASONE) 20 MG tablet Take 3 tablets (60 mg total) by mouth daily. 02/19/14   [name], DO  predniSONE (DELTASONE) 20 MG tablet Take 1 tablet (20 mg total) by mouth 2 (two) times daily with a meal. 03/11/14   Ladona Mow, PA-C  predniSONE (DELTASONE) 20 MG tablet Take 2 tablets (40 mg total) by mouth daily. 08/03/14   Raeford Razor, MD   BP 171/111 mmHg  Pulse 84  Temp(Src) 97.8 F (36.6 C) (Oral)  Resp 16  Wt 122 lb 9.6 oz (55.611 kg)  SpO2  100%  LMP 08/03/2014 Physical Exam  Constitutional: She appears well-developed and well-nourished. No distress.  HENT:  Head: Normocephalic and atraumatic.  Mild pharyngitis. No exudate. Uvula midline. Normal sounding voice. Neck is supple. No stridor. No adenopathy appreciated.  Eyes: Conjunctivae are normal. Right eye exhibits no discharge. Left eye exhibits no discharge.  Neck: Neck supple.  Cardiovascular: Normal rate, regular rhythm and normal heart sounds.  Exam reveals no gallop and no friction rub.   No murmur heard. Pulmonary/Chest: Effort normal. No respiratory distress. She has wheezes.  Faint expiratory wheezing. Speaking in complete sentences. No accessory muscle usage.  Abdominal: Soft. She exhibits no distension. There is no tenderness.  Musculoskeletal: She exhibits no edema or tenderness.  Lower extremities symmetric as compared to each other. No calf tenderness. Negative Homan's. No palpable cords.   Neurological: She is alert.  Skin: Skin is warm and dry.  Psychiatric: She has a normal mood and affect. Her behavior is normal. Thought content normal.  Nursing note and vitals reviewed.   ED Course  Procedures (including critical care time) Labs Review Labs Reviewed  LIPASE, BLOOD - Abnormal; Notable for the following:    Lipase 19 (*)    All other components within normal limits  COMPREHENSIVE METABOLIC PANEL - Abnormal; Notable for the following:    ALT 12 (*)  All other components within normal limits  CBC - Abnormal; Notable for the following:    Hemoglobin 11.5 (*)    HCT 35.4 (*)    All other components within normal limits  RAPID STREP SCREEN (NOT AT [facility])  CULTURE, GROUP A STREP  URINALYSIS, ROUTINE W REFLEX MICROSCOPIC (NOT AT [facility])  I-STAT BETA HCG BLOOD, ED ([facility], [facility], [facility] ONLY)    Imaging Review Dg Chest 2 View  08/03/2014   CLINICAL DATA:  Cough, nausea and wheezing.  History of asthma.  EXAM: CHEST  2 VIEW  COMPARISON:  03/11/2014  FINDINGS:  The heart size and mediastinal contours are within normal limits. Both lungs are clear. The visualized skeletal structures are unremarkable.  IMPRESSION: No active cardiopulmonary disease.   Electronically Signed   By: Richarda OverlieAdam  Henn M.D.   On: 08/03/2014 11:00     EKG Interpretation None      MDM   Final diagnoses:  Bronchitis    44 year old female with likely viral bronchitis. She is afebrile. She appears well. No increased work of breathing. Chest x-ray is clear. Plan symptomatic treatment. Return precautions were discussed.It has been determined that no acute conditions requiring further emergency intervention are present at this time. The patient has been advised of the diagnosis and plan. I reviewed any labs and imaging including any potential incidental findings. We have discussed signs and symptoms that warrant return to the ED and they are listed in the discharge instructions.      Raeford RazorStephen Jaivion Kingsley, MD 08/08/14 [phone]

## 2014-08-05 LAB — CULTURE, GROUP A STREP: Strep A Culture: NEGATIVE

## 2016-02-02 IMAGING — CR DG CHEST 1V PORT
1 series · 1 of 1 positions shown · non-contrast
Comparison: None.

CLINICAL DATA: Shortness of breath since this morning. History of
asthma.

EXAM:
PORTABLE CHEST - 1 VIEW

[AP]
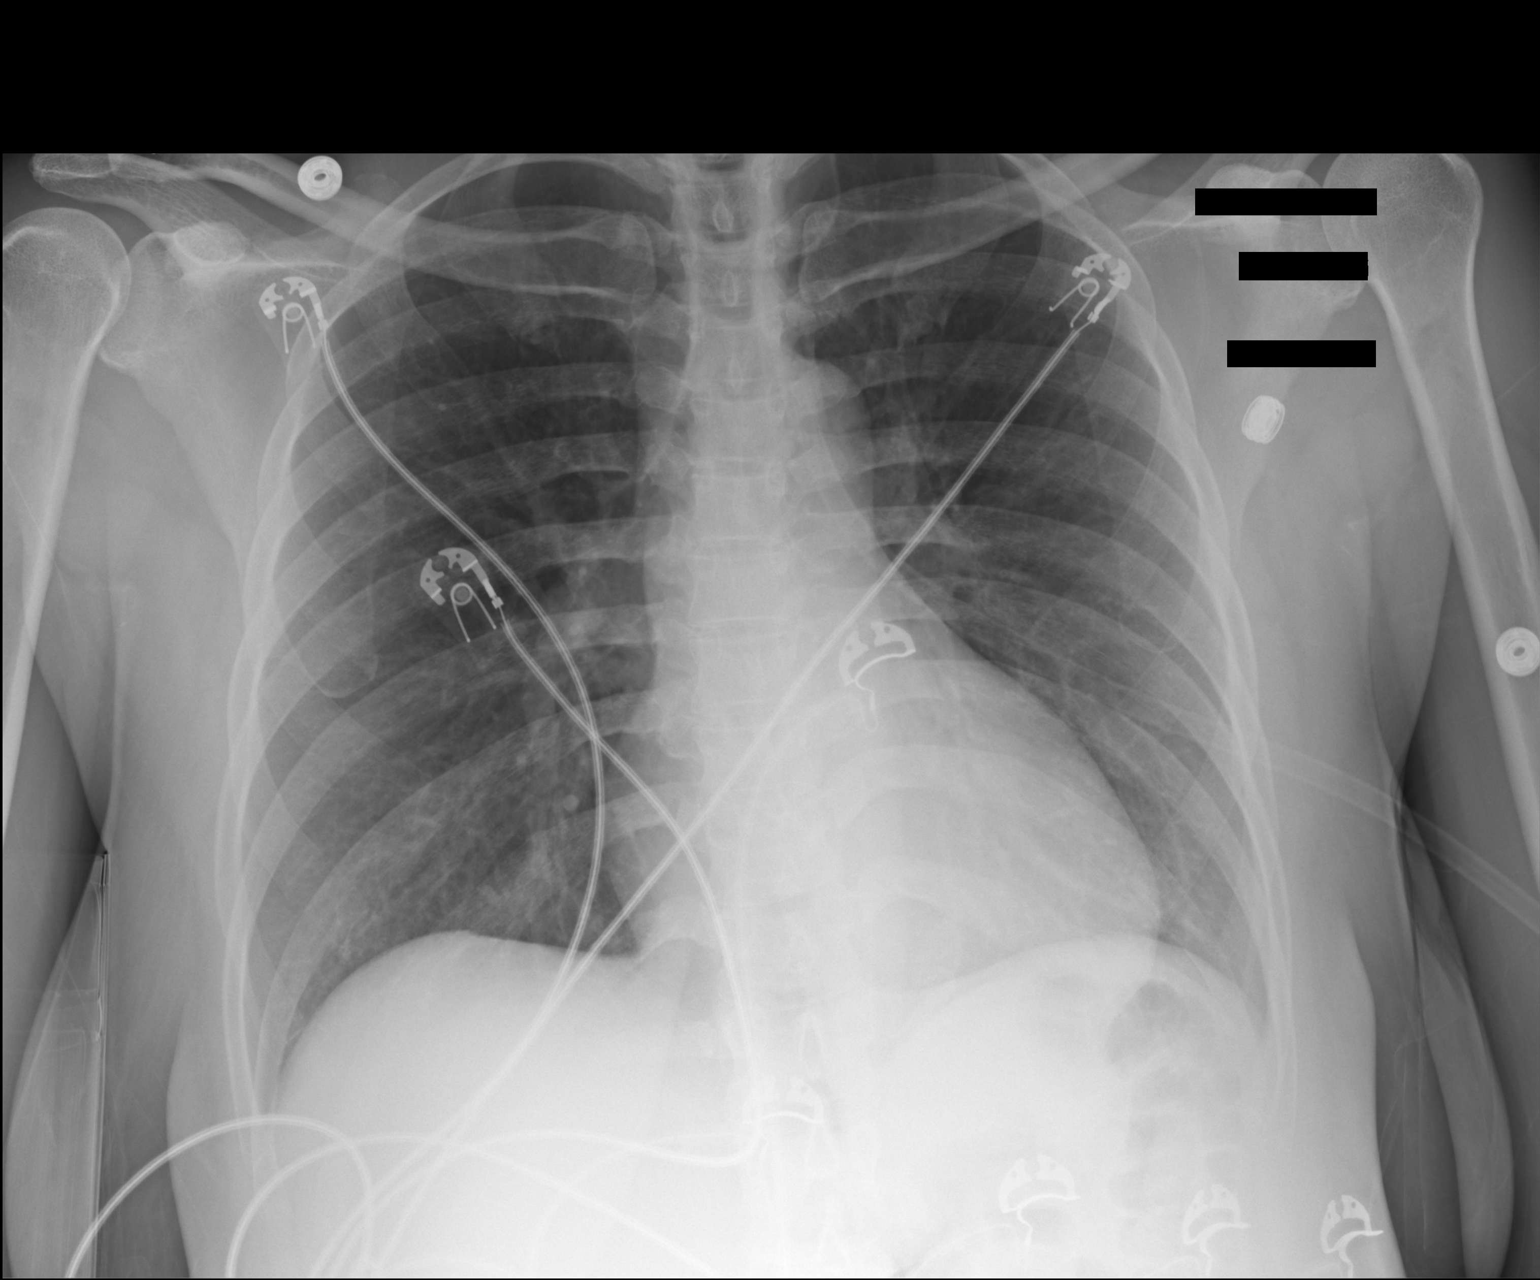

[1 of 1 positions shown; findings below may reference images not displayed]

FINDINGS: The cardiac silhouette, mediastinal and hilar contours are normal.
The lungs are clear. No pleural effusion or pneumothorax. The bony
thorax is intact.
IMPRESSION: No acute cardiopulmonary findings.

## 2016-02-22 IMAGING — CR DG CHEST 2V
2 series · 2 of 2 positions shown · non-contrast
Comparison: 02/19/2014

CLINICAL DATA: Asthma

EXAM:
CHEST  2 VIEW

[w chest pa]
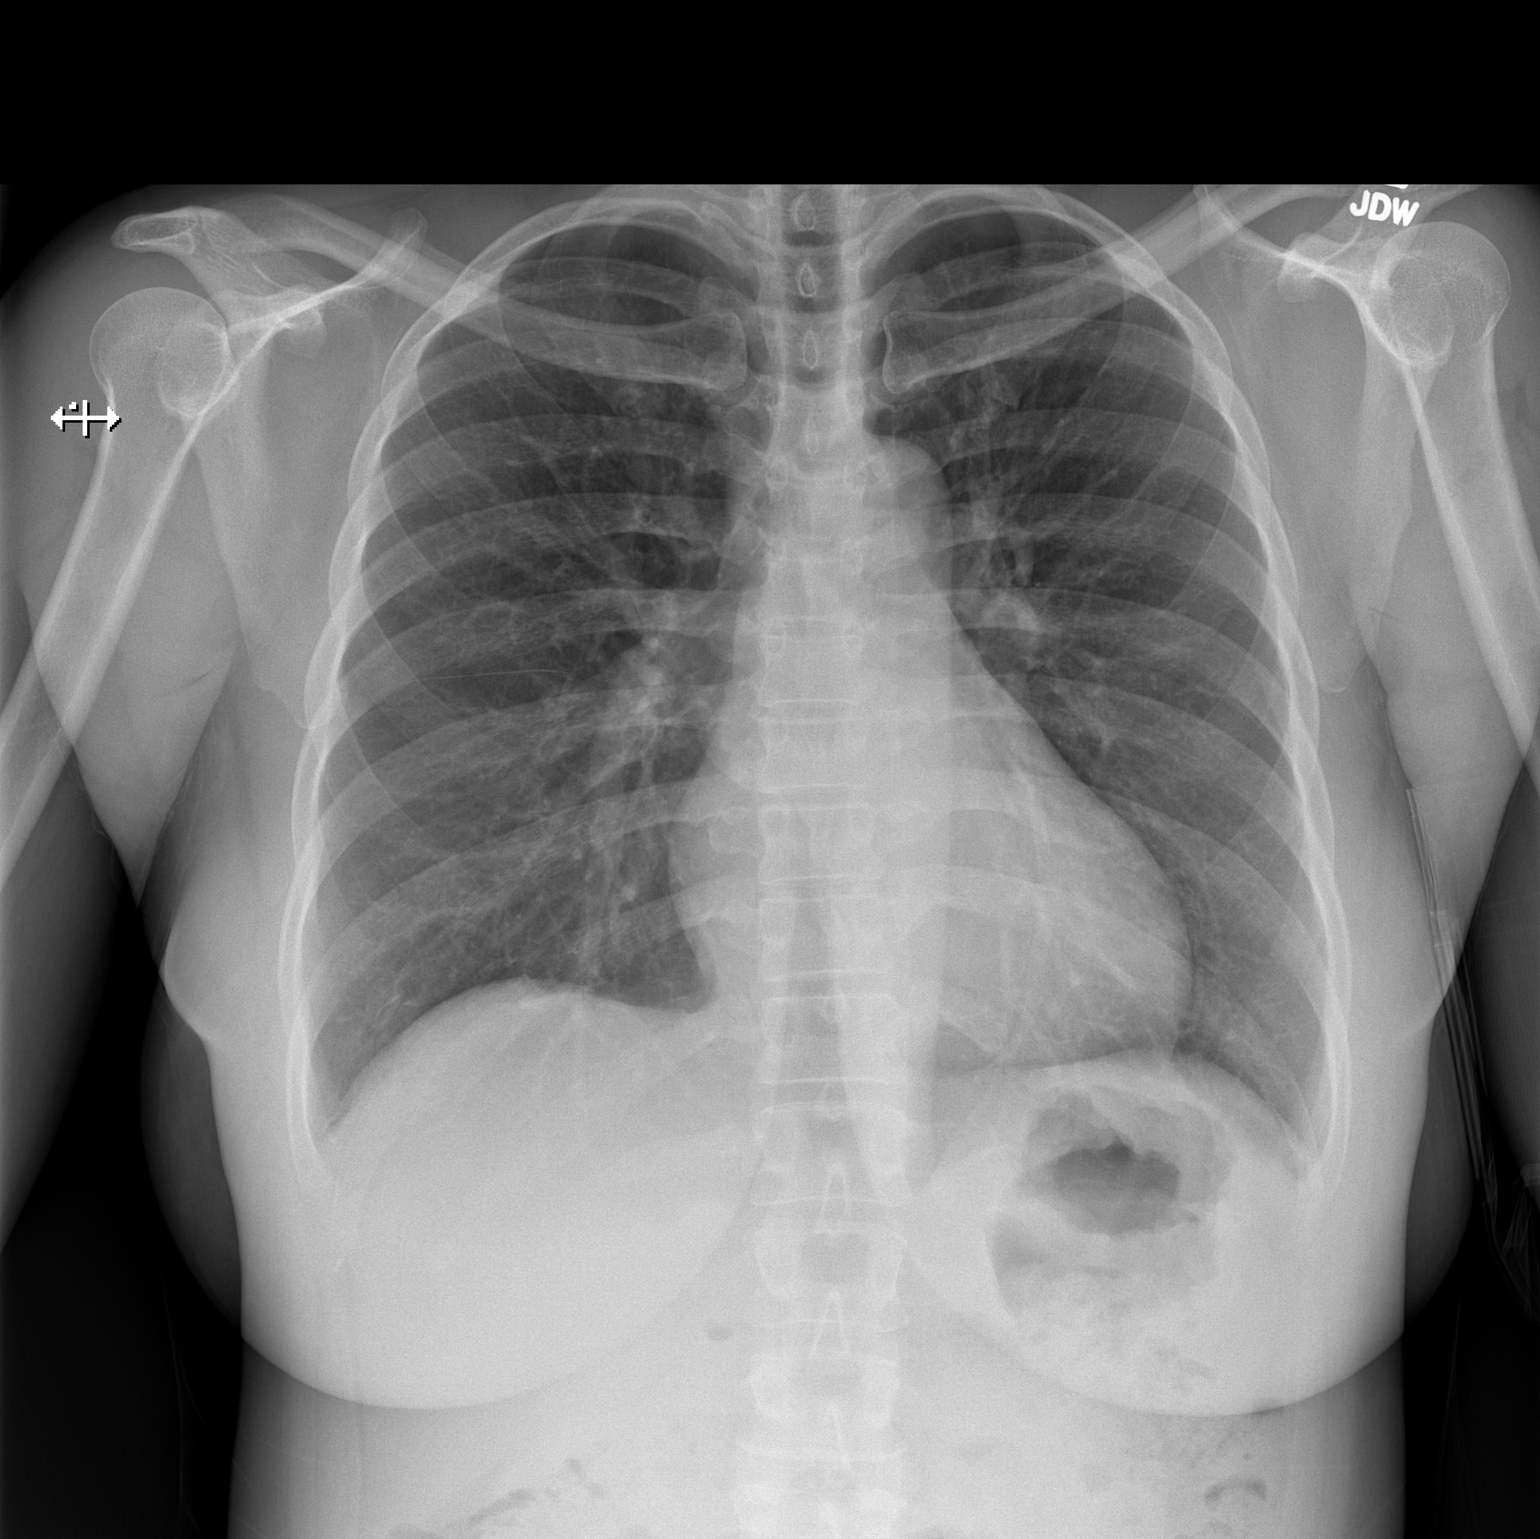

[w chest lat]
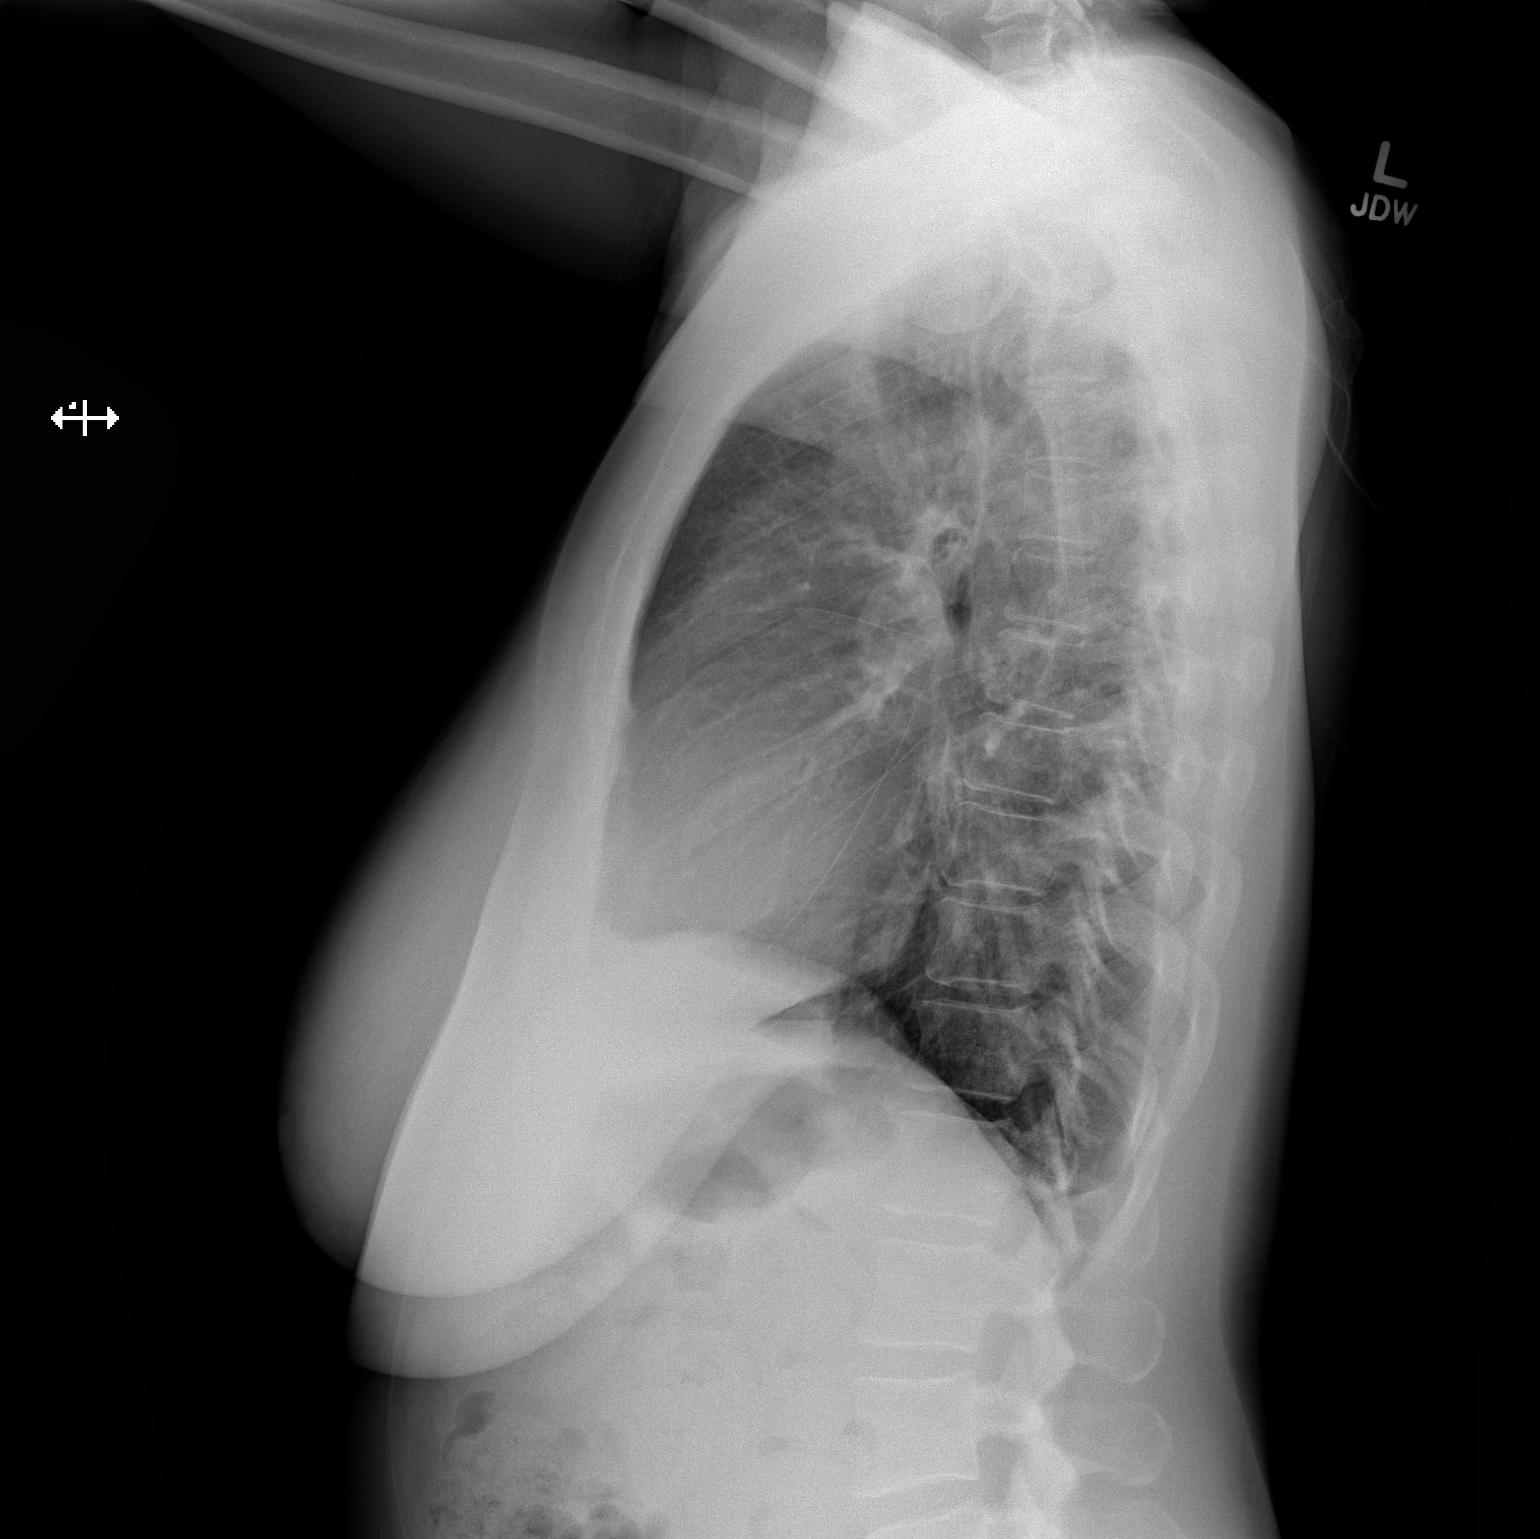

[2 of 2 positions shown; findings below may reference images not displayed]

FINDINGS: The heart size and mediastinal contours are within normal limits.
Both lungs are clear. The visualized skeletal structures are
unremarkable.
IMPRESSION: No active cardiopulmonary disease.
